# Patient Record
Sex: Male | Born: 1985 | Race: White | Hispanic: No | Marital: Single | Smoking: Current every day smoker
Health system: Southern US, Community
[De-identification: ages and names within clinical notes are randomized; demographics above are authoritative.]

## PROBLEM LIST (undated history)

## (undated) DIAGNOSIS — I517 Cardiomegaly: Secondary | ICD-10-CM

## (undated) DIAGNOSIS — F1110 Opioid abuse, uncomplicated: Secondary | ICD-10-CM

## (undated) HISTORY — PX: HAND SURGERY: SHX662

---
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
## 2010-05-25 ENCOUNTER — Emergency Department (HOSPITAL_COMMUNITY): Payer: Managed Care, Other (non HMO)

## 2010-05-25 ENCOUNTER — Inpatient Hospital Stay (HOSPITAL_COMMUNITY)
Admission: EM | Admit: 2010-05-25 | Discharge: 2010-05-27 | DRG: 464 | Disposition: A | Discharge status: Home / Self Care | Payer: Managed Care, Other (non HMO) | Attending: Orthopedic Surgery | Admitting: Orthopedic Surgery

## 2010-05-25 DIAGNOSIS — W540XXA Bitten by dog, initial encounter: Secondary | ICD-10-CM | POA: Diagnosis present

## 2010-05-25 DIAGNOSIS — L02519 Cutaneous abscess of unspecified hand: Secondary | ICD-10-CM | POA: Diagnosis present

## 2010-05-25 DIAGNOSIS — Z79899 Other long term (current) drug therapy: Secondary | ICD-10-CM

## 2010-05-25 DIAGNOSIS — S66909A Unspecified injury of unspecified muscle, fascia and tendon at wrist and hand level, unspecified hand, initial encounter: Secondary | ICD-10-CM | POA: Diagnosis present

## 2010-05-25 DIAGNOSIS — F341 Dysthymic disorder: Secondary | ICD-10-CM | POA: Diagnosis present

## 2010-05-25 DIAGNOSIS — M65839 Other synovitis and tenosynovitis, unspecified forearm: Principal | ICD-10-CM | POA: Diagnosis present

## 2010-05-25 DIAGNOSIS — S61409A Unspecified open wound of unspecified hand, initial encounter: Secondary | ICD-10-CM | POA: Diagnosis present

## 2010-05-25 LAB — DIFFERENTIAL
Basophils Absolute: 0 10*3/uL (ref 0.0–0.1)
Basophils Relative: 0 % (ref 0–1)
Eosinophils Absolute: 0 10*3/uL (ref 0.0–0.7)
Eosinophils Relative: 0 % (ref 0–5)
Lymphocytes Relative: 12 % (ref 12–46)
Lymphs Abs: 1.6 10*3/uL (ref 0.7–4.0)
Monocytes Absolute: 1.3 10*3/uL — ABNORMAL HIGH (ref 0.1–1.0)
Monocytes Relative: 9 % (ref 3–12)
Neutro Abs: 11.2 10*3/uL — ABNORMAL HIGH (ref 1.7–7.7)
Neutrophils Relative %: 79 % — ABNORMAL HIGH (ref 43–77)

## 2010-05-25 LAB — BASIC METABOLIC PANEL
BUN: 11 mg/dL (ref 6–23)
CO2: 28 mEq/L (ref 19–32)
Calcium: 9.2 mg/dL (ref 8.4–10.5)
Chloride: 104 mEq/L (ref 96–112)
Creatinine, Ser: 0.82 mg/dL (ref 0.4–1.5)
GFR calc Af Amer: >60 mL/min (ref >60)
GFR calc non Af Amer: >60 mL/min (ref >60)
Glucose, Bld: 88 mg/dL (ref 70–99)
Potassium: 3.8 mEq/L (ref 3.5–5.1)
Sodium: 141 mEq/L (ref 135–145)

## 2010-05-25 LAB — CBC
HCT: 45.3 % (ref 39.0–52.0)
Hemoglobin: 15.7 g/dL (ref 13.0–17.0)
MCH: 29.6 pg (ref 26.0–34.0)
MCHC: 34.7 g/dL (ref 30.0–36.0)
MCV: 85.3 fL (ref 78.0–100.0)
Platelets: 109 10*3/uL — ABNORMAL LOW (ref 150–400)
RBC: 5.31 MIL/uL (ref 4.22–5.81)
RDW: 13.3 % (ref 11.5–15.5)
WBC: 14.3 10*3/uL — ABNORMAL HIGH (ref 4.0–10.5)

## 2010-05-28 LAB — WOUND CULTURE: Culture: NO GROWTH

## 2010-05-31 NOTE — Op Note (Signed)
NAME:  Sean Wilkins, Sean Wilkins               ACCOUNT NO.:  [id number]  MEDICAL RECORD NO.:  [id number]           PATIENT TYPE:  LOCATION:                                 FACILITY:  PHYSICIAN:  Dionne Ano. Jyren Cerasoli, M.D.DATE OF BIRTH:  1985/07/21  DATE OF PROCEDURE: DATE OF DISCHARGE:                              OPERATIVE REPORT   PREOPERATIVE DIAGNOSIS:  Right hand dog bite with open MCP joint and index finger bite wound, ascending cellulitis, and pain.  POSTOPERATIVE DIAGNOSIS:  Right hand dog bite with open MCP joint and index finger bite wound, ascending cellulitis, and pain.  PROCEDURE: 1. Irrigation and debridement, right hand open MCP joint.  This was an     open arthrotomy and synovectomy of the metacarpal phalangeal joint     of the right middle finger with excisional debridement of the skin,     subcutaneous tissue, and tendon tissue. 2. Incision and decompression of tendon sheath infection, right middle     finger, secondary to purulent tenosynovitis.  This was a tenolysis     and tenosynovectomy about the extensor apparatus, right hand and     middle finger. 3. Irrigation and debridement of right index finger skin and     subcutaneous tissue.  This was an excisional debridement of     separate dog bite, which did not penetrate the tendon about the     index finger.  SURGEON:  Dionne Ano. Amanda Pea, MD.  ASSISTANT:  None.  COMPLICATIONS:  None.  ANESTHESIA:  General.  DRAINS:  Two vessel loop drains were placed.  TOURNIQUET TIME:  Less than an hour.  INDICATIONS FOR PROCEDURE:  This patient is a 25 year old male, had a dog bite days ago.  He presents with the above-mentioned operative intervention.  Understanding and accepting the risks and benefits of bleeding, infection, anesthesia, damage to normal structures, and failure of the surgery to accomplish its intended goals of relieving symptoms and restoring function.  With this mind, he desires to proceed. All questions  have been encouraged and answered preoperatively.  I have specifically discussed with the patient the dog bite and its associated issues.  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  There are no other injuries.  He complains barely of pain, and given the time frame duration from bite, I feel that I and D is absolutely mandatory.  OPERATIVE PROCEDURE:  The patient was seen by myself and Anesthesia, taken to the operative suite, underwent smooth induction of general anesthesia, laid supine and properly padded, prepped and draped in the usual sterile fashion with Betadine scrub and paint.  Time-out was called.  Consent verified and explained to the patient preoperatively of course.  I commenced the operation with knife blade incision about the middle finger MCP joint.  Dissection was carried down.  Tendon injury was noted.  Tenolysis, tenosynovectomy, and decompression of the tendon sheath was accomplished.  This was a purulent extensor tenosynovitis and I cultured this for aerobic and anaerobic culture.  Following this, I then performed open arthrotomy and synovectomy of the MCP joint.  There was a large amount of devitalized tissue in this region.  It was removed and the joint was exposed.  The chondral surfaces were adequate.  Following this, 3 liters of saline were placed through the wound.  Irrigation debridement of the skin and subcutaneous tissue was then accomplished.  Following this, I then performed irrigation and debridement of the right index finger.  This was irrigation and debridement of the skin and subcutaneous tissue.  The patient tolerated this quite well with no complicating features.  Once this excisional debridement of the index finger was accomplished, I then performed loose closure over the vessel loop drain of the MCP joint, irrigated additionally and placed Adaptic followed by a volar plaster splint.  He was given Unasyn after cultures were taken.  We will plan for Unasyn 3  grams IV q.6 h, Percocet for pain, general postop pain management, elevation, and wound care.  Once he is stable, advanced into Augmentin.  I have discussed [name] and his mother all issues, do's and don'ts, etc.  It has been a pleasure to participate in his care, I look forward to participating in his postop care.     Dionne Ano. Amanda Pea, M.D.     [facility]  D:  05/25/2010  T:  05/26/2010  Job:  [id number]  Electronically Signed by Dominica Severin M.D. on 05/31/2010 08:26:17 PM

## 2012-10-31 ENCOUNTER — Encounter (HOSPITAL_COMMUNITY): Payer: Self-pay | Admitting: *Deleted

## 2012-10-31 ENCOUNTER — Emergency Department (HOSPITAL_COMMUNITY)
Admission: EM | Admit: 2012-10-31 | Discharge: 2012-10-31 | Disposition: A | Discharge status: Home / Self Care | Payer: Self-pay | Attending: Emergency Medicine | Admitting: Emergency Medicine

## 2012-10-31 DIAGNOSIS — F17200 Nicotine dependence, unspecified, uncomplicated: Secondary | ICD-10-CM | POA: Insufficient documentation

## 2012-10-31 DIAGNOSIS — K047 Periapical abscess without sinus: Secondary | ICD-10-CM | POA: Insufficient documentation

## 2012-10-31 MED ORDER — HYDROCODONE-ACETAMINOPHEN 5-325 MG PO TABS
1.0000 | ORAL_TABLET | Freq: Once | ORAL | Status: AC
  Administered 2012-10-31: 1 via ORAL
  Filled 2012-10-31: qty 1

## 2012-10-31 MED ORDER — AMOXICILLIN 500 MG PO CAPS: 500.0000 mg | ORAL_CAPSULE | Freq: Three times a day (TID) | ORAL | Status: DC

## 2012-10-31 MED ORDER — HYDROCODONE-ACETAMINOPHEN 5-325 MG PO TABS: 1.0000 | ORAL_TABLET | ORAL | Status: DC | PRN

## 2012-10-31 MED ORDER — IBUPROFEN 600 MG PO TABS: 600.0000 mg | ORAL_TABLET | Freq: Four times a day (QID) | ORAL | Status: DC | PRN

## 2012-10-31 MED ORDER — AMOXICILLIN 250 MG PO CAPS
500.0000 mg | ORAL_CAPSULE | Freq: Once | ORAL | Status: AC
  Administered 2012-10-31: 500 mg via ORAL
  Filled 2012-10-31: qty 2

## 2012-10-31 NOTE — ED Notes (Signed)
Dental pain to right upper tooth. Swelling to right side of the face. Swelling began last night. States eye was swollen shut this morning.

## 2012-10-31 NOTE — ED Provider Notes (Signed)
CSN: [id number]     Arrival date & time 10/31/12  1235 History   First MD Initiated Contact with Patient 10/31/12 1255     Chief Complaint  Patient presents with  . Dental Pain   (Consider location/radiation/quality/duration/timing/severity/associated sxs/prior Treatment) Patient is a 27 y.o. male presenting with tooth pain. The history is provided by the patient.  Dental Pain Location:  Upper Upper teeth location:  7/RU lateral incisor Quality:  Throbbing and constant Onset quality:  Gradual Duration:  2 days Progression:  Worsening Chronicity:  New Context: abscess, dental caries, dental fracture and poor dentition   Relieved by:  Nothing Worsened by:  Cold food/drink, hot food/drink and pressure Associated symptoms: facial pain and facial swelling   Associated symptoms: no fever and no headaches   Risk factors: lack of dental care and smoking    Sean Wilkins is a 27 y.o. male who presents to the ED with dental pain and facial swelling. States he has chronic dental problems but last night was hurting and this morning facial swelling and increased pain.   History reviewed. No pertinent past medical history. Past Surgical History  Procedure Laterality Date  . Hand surgery     No family history on file. History  Substance Use Topics  . Smoking status: Current Every Day Smoker    Types: Cigarettes  . Smokeless tobacco: Former Neurosurgeon  . Alcohol Use: No    Review of Systems  Constitutional: Negative for fever and chills.  HENT: Positive for facial swelling and dental problem.   Eyes: Negative for redness.  Gastrointestinal: Negative for nausea and vomiting.  Musculoskeletal: Negative for back pain.  Skin: Negative for rash.  Neurological: Negative for dizziness and headaches.  Psychiatric/Behavioral: The patient is not nervous/anxious.     Allergies  Review of patient's allergies indicates no known allergies.  Home Medications   Current Outpatient Rx  Name  Route   Sig  Dispense  Refill  . ibuprofen (ADVIL,MOTRIN) 200 MG tablet   Oral   Take 400 mg by mouth every 6 (six) hours as needed for pain.          BP 111/71  Pulse 85  Temp(Src) 98.1 F (36.7 C) (Oral)  Resp 20  Ht 6' (1.829 m)  Wt 135 lb (61.236 kg)  BMI 18.31 kg/m2  SpO2 100% Physical Exam  Nursing note and vitals reviewed. Constitutional: He is oriented to person, place, and time. He appears well-developed and well-nourished.  HENT:  Head: Head is without right periorbital erythema.    Mouth/Throat: Uvula is midline, oropharynx is clear and moist and mucous membranes are normal.    Facial swelling right. Multiple caries with abscess right upper.   Eyes: Conjunctivae and EOM are normal.  Neck: Normal range of motion. Neck supple.  Cardiovascular: Normal rate and regular rhythm.   Pulmonary/Chest: Effort normal and breath sounds normal.  Musculoskeletal: Normal range of motion.  Lymphadenopathy:    He has no cervical adenopathy.  Neurological: He is alert and oriented to person, place, and time. No cranial nerve deficit.  Skin: Skin is warm and dry.  Psychiatric: He has a normal mood and affect. His behavior is normal.    ED Course  Procedures Discussed in detail with the patient need for antibiotics and follow up with dentist as soon as possible and if swelling increases, fever, or other problems to return here. MDM  27 y.o. male with dental caries and dental abscess. Discussed with the patient in detail need  for follow up as soon as possible for dental care. Discussed infection and if spreads and he has severe headache, n/v, fever or other problems to return immediately.  Patient voices understanding.    Medication List         amoxicillin 500 MG capsule  Commonly known as:  AMOXIL  Take 1 capsule (500 mg total) by mouth 3 (three) times daily.     HYDROcodone-acetaminophen 5-325 MG per tablet  Commonly known as:  NORCO/VICODIN  Take 1 tablet by mouth every 4  (four) hours as needed.     ibuprofen 600 MG tablet  Commonly known as:  ADVIL,MOTRIN  Take 1 tablet (600 mg total) by mouth every 6 (six) hours as needed for pain.           [facility] Orlene Och, [place] 10/31/12 1452

## 2012-11-01 NOTE — ED Provider Notes (Signed)
Medical screening examination/treatment/procedure(s) were performed by non-physician practitioner and as supervising physician I was immediately available for consultation/collaboration.   Felipa Laroche David Charitie Hinote, MD 11/01/12 0813 

## 2012-12-27 ENCOUNTER — Emergency Department (HOSPITAL_COMMUNITY)
Admission: EM | Admit: 2012-12-27 | Discharge: 2012-12-27 | Disposition: A | Discharge status: Home / Self Care | Payer: Self-pay | Attending: Emergency Medicine | Admitting: Emergency Medicine

## 2012-12-27 ENCOUNTER — Encounter (HOSPITAL_COMMUNITY): Payer: Self-pay | Admitting: Emergency Medicine

## 2012-12-27 ENCOUNTER — Emergency Department (HOSPITAL_COMMUNITY): Payer: Self-pay

## 2012-12-27 DIAGNOSIS — F17200 Nicotine dependence, unspecified, uncomplicated: Secondary | ICD-10-CM | POA: Insufficient documentation

## 2012-12-27 DIAGNOSIS — M25569 Pain in unspecified knee: Secondary | ICD-10-CM | POA: Insufficient documentation

## 2012-12-27 DIAGNOSIS — Z792 Long term (current) use of antibiotics: Secondary | ICD-10-CM | POA: Insufficient documentation

## 2012-12-27 DIAGNOSIS — M25561 Pain in right knee: Secondary | ICD-10-CM

## 2012-12-27 HISTORY — DX: Cardiomegaly: I51.7

## 2012-12-27 MED ORDER — NAPROXEN 500 MG PO TABS: 500.0000 mg | ORAL_TABLET | Freq: Two times a day (BID) | ORAL | Status: DC

## 2012-12-27 MED ORDER — HYDROCODONE-ACETAMINOPHEN 5-325 MG PO TABS: 1.0000 | ORAL_TABLET | ORAL | Status: DC | PRN

## 2012-12-27 NOTE — ED Provider Notes (Signed)
Medical screening examination/treatment/procedure(s) were performed by non-physician practitioner and as supervising physician I was immediately available for consultation/collaboration.  EKG Interpretation   None       Yandell Mcjunkins, MD, FACEP   Matthew Cina L Castella Lerner, MD 12/27/12 1555 

## 2012-12-27 NOTE — ED Notes (Signed)
Rt knee pain/swelling for 3-4 days, no known injury

## 2012-12-27 NOTE — ED Provider Notes (Signed)
CSN: [id number]     Arrival date & time 12/27/12  1406 History   First MD Initiated Contact with Patient 12/27/12 1418     Chief Complaint  Patient presents with  . Knee Pain   (Consider location/radiation/quality/duration/timing/severity/associated sxs/prior Treatment) Patient is a 27 y.o. male presenting with knee pain. The history is provided by the patient.  Knee Pain Location:  Knee Time since incident:  3 days Injury: no   Knee location:  R knee Pain details:    Quality:  Aching   Radiates to:  Does not radiate   Severity:  Moderate   Onset quality:  Gradual   Duration:  3 days   Timing:  Constant Chronicity:  New Dislocation: no   Foreign body present:  No foreign bodies Prior injury to area:  No Relieved by:  Nothing Worsened by:  Bearing weight Ineffective treatments:  None tried Associated symptoms: swelling   Associated symptoms: no fever    Nehemiah Mcfarren is a 27 y.o. male who presents to the ED with right knee pain. The pain started after he had been going up and down a ladder helping his parents do some work. He has noted swelling in the right knee. He has taken Advil for pain without relief.    Past Medical History  Diagnosis Date  . Heart enlargement    Past Surgical History  Procedure Laterality Date  . Hand surgery     History reviewed. No pertinent family history. History  Substance Use Topics  . Smoking status: Current Every Day Smoker    Types: Cigarettes  . Smokeless tobacco: Former Neurosurgeon  . Alcohol Use: No    Review of Systems  Constitutional: Negative for fever and chills.  HENT: Negative.   Eyes: Negative for visual disturbance.  Respiratory: Negative for chest tightness and shortness of breath.   Cardiovascular: Negative for chest pain.  Gastrointestinal: Negative for nausea and vomiting.  Genitourinary: Negative for dysuria and urgency.  Musculoskeletal:       Right knee pain  Skin: Negative for wound.  Allergic/Immunologic:  Negative for immunocompromised state.  Neurological: Negative for light-headedness and headaches.  Psychiatric/Behavioral: The patient is not nervous/anxious.     Allergies  Review of patient's allergies indicates no known allergies.  Home Medications   Current Outpatient Rx  Name  Route  Sig  Dispense  Refill  . amoxicillin (AMOXIL) 500 MG capsule   Oral   Take 1 capsule (500 mg total) by mouth 3 (three) times daily.   30 capsule   0   . HYDROcodone-acetaminophen (NORCO/VICODIN) 5-325 MG per tablet   Oral   Take 1 tablet by mouth every 4 (four) hours as needed.   15 tablet   0   . ibuprofen (ADVIL,MOTRIN) 600 MG tablet   Oral   Take 1 tablet (600 mg total) by mouth every 6 (six) hours as needed for pain.   30 tablet   0    BP 108/70  Pulse 86  Temp(Src) 97.3 F (36.3 C) (Oral)  Resp 18  Ht 6' (1.829 m)  Wt 135 lb (61.236 kg)  BMI 18.31 kg/m2  SpO2 92% Physical Exam  Nursing note and vitals reviewed. Constitutional: He is oriented to person, place, and time. He appears well-developed and well-nourished. No distress.  HENT:  Head: Normocephalic and atraumatic.  Eyes: Conjunctivae and EOM are normal.  Neck: Normal range of motion. Neck supple.  Cardiovascular: Normal rate.   Pulmonary/Chest: Effort normal.  Musculoskeletal:  Right knee: He exhibits swelling. He exhibits normal range of motion, no ecchymosis, no deformity, no laceration, no erythema, normal alignment and no LCL laxity. Tenderness found. MCL tenderness noted.  Neurological: He is alert and oriented to person, place, and time. No cranial nerve deficit.  Skin: Skin is warm and dry.  Psychiatric: He has a normal mood and affect. His behavior is normal.    ED Course  Procedures  Dg Knee Complete 4 Views Right  12/27/2012   CLINICAL DATA:  Right knee pain for 3 days.  No known injury.  EXAM: RIGHT KNEE - COMPLETE 4+ VIEW  COMPARISON:  None.  FINDINGS: There is no evidence of fracture,  dislocation, or joint effusion. There is no evidence of arthropathy or other focal bone abnormality. Soft tissues are unremarkable.  IMPRESSION: Negative.   Electronically Signed   By: Drusilla Kanner M.D.   On: 12/27/2012 15:09    MDM  27 y.o. male with pain and swelling in his right knee after going up and down a ladder several times 3 days ago. Placed in knee immobilizer, ice, elevation and follow up with ortho if symptoms persist.  I have reviewed this patient's vital signs, nurses notes, appropriate imaging and discussed findings and plan of care with the patient. He voices understanding. Stable for discharge without any immediate complications.    Medication List         HYDROcodone-acetaminophen 5-325 MG per tablet  Commonly known as:  NORCO/VICODIN  Take 1 tablet by mouth every 4 (four) hours as needed.     naproxen 500 MG tablet  Commonly known as:  NAPROSYN  Take 1 tablet (500 mg total) by mouth 2 (two) times daily with a meal.             Janne Napoleon, NP 12/27/12 1541

## 2012-12-30 ENCOUNTER — Emergency Department (HOSPITAL_COMMUNITY)
Admission: EM | Admit: 2012-12-30 | Discharge: 2012-12-30 | Disposition: A | Discharge status: Home / Self Care | Payer: Self-pay | Attending: Emergency Medicine | Admitting: Emergency Medicine

## 2012-12-30 ENCOUNTER — Encounter (HOSPITAL_COMMUNITY): Payer: Self-pay | Admitting: Emergency Medicine

## 2012-12-30 DIAGNOSIS — K0889 Other specified disorders of teeth and supporting structures: Secondary | ICD-10-CM

## 2012-12-30 DIAGNOSIS — F17200 Nicotine dependence, unspecified, uncomplicated: Secondary | ICD-10-CM | POA: Insufficient documentation

## 2012-12-30 DIAGNOSIS — Z8679 Personal history of other diseases of the circulatory system: Secondary | ICD-10-CM | POA: Insufficient documentation

## 2012-12-30 DIAGNOSIS — Z79899 Other long term (current) drug therapy: Secondary | ICD-10-CM | POA: Insufficient documentation

## 2012-12-30 DIAGNOSIS — K089 Disorder of teeth and supporting structures, unspecified: Secondary | ICD-10-CM | POA: Insufficient documentation

## 2012-12-30 DIAGNOSIS — K029 Dental caries, unspecified: Secondary | ICD-10-CM | POA: Insufficient documentation

## 2012-12-30 MED ORDER — IBUPROFEN 800 MG PO TABS: 800.0000 mg | ORAL_TABLET | Freq: Three times a day (TID) | ORAL | Status: DC

## 2012-12-30 MED ORDER — TRAMADOL HCL 50 MG PO TABS: ORAL_TABLET | ORAL | Status: DC

## 2012-12-30 MED ORDER — AMOXICILLIN 500 MG PO CAPS: 500.0000 mg | ORAL_CAPSULE | Freq: Three times a day (TID) | ORAL | Status: DC

## 2012-12-30 NOTE — ED Notes (Signed)
Pt c/o right upper dental pain since yesterday.

## 2012-12-30 NOTE — ED Provider Notes (Signed)
CSN: [id number]     Arrival date & time 12/30/12  1736 History   First MD Initiated Contact with Patient 12/30/12 1738     Chief Complaint  Patient presents with  . Dental Pain   (Consider location/radiation/quality/duration/timing/severity/associated sxs/prior Treatment) Patient is a 27 y.o. male presenting with tooth pain. The history is provided by the patient.  Dental Pain Location:  Upper Upper teeth location:  2/RU 2nd molar Quality:  Aching Severity:  Moderate Duration:  1 day Timing:  Constant Progression:  Worsening Chronicity:  Chronic Context: poor dentition   Context: not trauma   Worsened by:  Hot food/drink Ineffective treatments:  None tried Associated symptoms: no difficulty swallowing and no neck pain   Risk factors: smoking     Past Medical History  Diagnosis Date  . Heart enlargement    Past Surgical History  Procedure Laterality Date  . Hand surgery     History reviewed. No pertinent family history. History  Substance Use Topics  . Smoking status: Current Every Day Smoker    Types: Cigarettes  . Smokeless tobacco: Former Neurosurgeon  . Alcohol Use: No    Review of Systems  Constitutional: Negative for activity change.       All ROS Neg except as noted in HPI  HENT: Positive for dental problem. Negative for nosebleeds.   Eyes: Negative for photophobia and discharge.  Respiratory: Negative for cough, shortness of breath and wheezing.   Cardiovascular: Negative for chest pain and palpitations.  Gastrointestinal: Negative for abdominal pain and blood in stool.  Genitourinary: Negative for dysuria, frequency and hematuria.  Musculoskeletal: Negative for arthralgias, back pain and neck pain.  Skin: Negative.   Neurological: Negative for dizziness, seizures and speech difficulty.  Psychiatric/Behavioral: Negative for hallucinations and confusion.    Allergies  Review of patient's allergies indicates no known allergies.  Home Medications   Current  Outpatient Rx  Name  Route  Sig  Dispense  Refill  . HYDROcodone-acetaminophen (NORCO/VICODIN) 5-325 MG per tablet   Oral   Take 1 tablet by mouth every 4 (four) hours as needed.   15 tablet   0   . naproxen (NAPROSYN) 500 MG tablet   Oral   Take 1 tablet (500 mg total) by mouth 2 (two) times daily with a meal.   15 tablet   0    BP 106/60  Pulse 70  Temp(Src) 97.5 F (36.4 C) (Oral)  Resp 24  Ht 6' (1.829 m)  Wt 135 lb (61.236 kg)  BMI 18.31 kg/m2  SpO2 100% Physical Exam  Nursing note and vitals reviewed. Constitutional: He is oriented to person, place, and time. He appears well-developed and well-nourished.  Non-toxic appearance.  HENT:  Head: Normocephalic.  Right Ear: Tympanic membrane and external ear normal.  Left Ear: Tympanic membrane and external ear normal.  Multiple dental caries of the upper and lower jaw. Mild swelling of the right upper gum area. No visible abscess. Airway patent. No swelling under the tongue.  Eyes: EOM and lids are normal. Pupils are equal, round, and reactive to light.  Neck: Normal range of motion. Neck supple. Carotid bruit is not present.  Cardiovascular: Normal rate, regular rhythm, normal heart sounds, intact distal pulses and normal pulses.   Pulmonary/Chest: Breath sounds normal. No respiratory distress.  Abdominal: Soft. Bowel sounds are normal. There is no tenderness. There is no guarding.  Musculoskeletal: Normal range of motion.  Lymphadenopathy:       Head (right side): No submandibular  adenopathy present.       Head (left side): No submandibular adenopathy present.    He has no cervical adenopathy.  Neurological: He is alert and oriented to person, place, and time. He has normal strength. No cranial nerve deficit or sensory deficit.  Skin: Skin is warm and dry.  Psychiatric: He has a normal mood and affect. His speech is normal.    ED Course  Procedures (including critical care time) Labs Review Labs Reviewed - No data  to display Imaging Review No results found.  EKG Interpretation   None       MDM  No diagnosis found. **I have reviewed nursing notes, vital signs, and all appropriate lab and imaging results for this patient.*  Vital signs wnl. No visible abscess. No evidence for Ludwig's Angina. Dental resource guide given to the patient. Rx for ibuprofen, tramadol, and amoxil given to the patient. Pt advised to see a dentist as soon as possible.  Kathie Dike, PA-C 12/30/12 [phone]

## 2013-01-01 NOTE — ED Provider Notes (Signed)
Medical screening examination/treatment/procedure(s) were performed by non-physician practitioner and as supervising physician I was immediately available for consultation/collaboration.  EKG Interpretation   None        Donnetta Hutching, MD 01/01/13 1054

## 2013-08-19 ENCOUNTER — Encounter (HOSPITAL_COMMUNITY): Payer: Self-pay | Admitting: Emergency Medicine

## 2013-08-19 ENCOUNTER — Emergency Department (HOSPITAL_COMMUNITY)
Admission: EM | Admit: 2013-08-19 | Discharge: 2013-08-19 | Disposition: A | Discharge status: Home / Self Care | Payer: Self-pay | Attending: Emergency Medicine | Admitting: Emergency Medicine

## 2013-08-19 DIAGNOSIS — F17200 Nicotine dependence, unspecified, uncomplicated: Secondary | ICD-10-CM | POA: Insufficient documentation

## 2013-08-19 DIAGNOSIS — L255 Unspecified contact dermatitis due to plants, except food: Secondary | ICD-10-CM | POA: Insufficient documentation

## 2013-08-19 DIAGNOSIS — K029 Dental caries, unspecified: Secondary | ICD-10-CM | POA: Insufficient documentation

## 2013-08-19 DIAGNOSIS — K089 Disorder of teeth and supporting structures, unspecified: Secondary | ICD-10-CM | POA: Insufficient documentation

## 2013-08-19 DIAGNOSIS — Z8679 Personal history of other diseases of the circulatory system: Secondary | ICD-10-CM | POA: Insufficient documentation

## 2013-08-19 MED ORDER — PREDNISONE 10 MG PO TABS: 20.0000 mg | ORAL_TABLET | Freq: Two times a day (BID) | ORAL | Status: DC

## 2013-08-19 MED ORDER — DEXAMETHASONE SODIUM PHOSPHATE 4 MG/ML IJ SOLN
8.0000 mg | Freq: Once | INTRAMUSCULAR | Status: AC
  Administered 2013-08-19: 8 mg via INTRAMUSCULAR
  Filled 2013-08-19: qty 2

## 2013-08-19 MED ORDER — CETIRIZINE HCL 10 MG PO CAPS: 1.0000 | ORAL_CAPSULE | Freq: Every day | ORAL | Status: DC

## 2013-08-19 MED ORDER — AMOXICILLIN 500 MG PO CAPS
500.0000 mg | ORAL_CAPSULE | Freq: Three times a day (TID) | ORAL | Status: DC
Start: 2013-08-19 — End: 2014-06-26

## 2013-08-19 NOTE — ED Provider Notes (Signed)
CSN: [id number]     Arrival date & time 08/19/13  1404 History   First [id number] Initiated Contact with Patient 08/19/13 1521     Chief Complaint  Patient presents with  . Rash   Patient is a 28 y.o. male presenting with rash. The history is provided by the patient. No language interpreter was used.  Rash Associated symptoms: no fever    This chart was scribed for nurse practitioner working with Benny LennertJoseph L Zammit, MD, by Andrew Auaven Small, ED Scribe. This patient was seen in room APFT23/APFT23 and the patient's care was started at 3:23 PM.  Sean Wilkins is a 28 y.o. male who presents to the Emergency Department complaining of and itchy erythematous rash. 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  Rash is located to bilateral arms and torso.    Pt is also c/o dental pain. Pt reports pain to left lower 2nd molar. He is unsure of any chips and breaks to tooth. Pt has taken ibuprofen with very mild relief to pain.   Past Medical History  Diagnosis Date  . Heart enlargement    Past Surgical History  Procedure Laterality Date  . Hand surgery     History reviewed. No pertinent family history. History  Substance Use Topics  . Smoking status: Current Every Day Smoker    Types: Cigarettes  . Smokeless tobacco: Former NeurosurgeonUser  . Alcohol Use: No    Review of Systems  Constitutional: Negative for fever and chills.  HENT: Positive for dental problem.   Skin: Positive for color change and rash. Negative for wound.  all other systems negative  Allergies  Review of patient's allergies indicates no known allergies.  Home Medications   Prior to Admission medications   Medication Sig Start Date End Date Taking? Authorizing Provider  diphenhydrAMINE (BENADRYL) 25 MG tablet Take 25 mg by mouth once as needed.   Yes Historical Provider, MD  ibuprofen (ADVIL,MOTRIN) 200 MG tablet Take 800 mg by mouth every 6 (six) hours as needed.   Yes Historical  Provider, MD  OVER THE COUNTER MEDICATION Apply 1 application topically as needed [facility](TEKNU-For Poison Ivy/[place]).   Yes Historical Provider, MD   BP 116/64  Pulse 64  Temp(Src) 98 F (36.7 C) (Oral)  Resp 18  Ht 6' (1.829 m)  Wt 135 lb (61.236 kg)  BMI 18.31 kg/m2  SpO2 100% Physical Exam  Nursing note and vitals reviewed. Constitutional: He is oriented to person, place, and time. He appears well-developed and well-nourished. No distress.  HENT:  Head: Normocephalic and atraumatic.  Left lower 2nd molar with erythema and swelling on gum surrounding the tooth. 1st molar has been extracted  Eyes: Conjunctivae and EOM are normal.  Neck: Neck supple.  Cardiovascular: Normal rate.   Pulmonary/Chest: Effort normal.  Musculoskeletal: Normal range of motion.  Neurological: He is alert and oriented to person, place, and time.  Skin: Skin is warm and dry.  Vesicular linear rash consistent with contact dermatitis with plant located bilateral forearms, abdomen and chest  Psychiatric: He has a normal mood and affect. His behavior is normal.    ED Course  Procedures (including critical care time) 3:30 PM-Discussed treatment plan which includes abx and steroids with pt at bedside and pt agreed to plan.   MDM  28 y.o. male with rash and itching after exposure to poison ivy while doing landscaping work. Also with dental pain. Will treat for contact  dermitis and dental infection. Stable for discharge without fever. He does not appear toxic.Marland Kitchen. Discussed with the patient and all questioned fully answered.    Medication List    TAKE these medications       amoxicillin 500 MG capsule  Commonly known as:  AMOXIL  Take 1 capsule (500 mg total) by mouth 3 (three) times daily.     Cetirizine HCl 10 MG Caps  Commonly known as:  ZYRTEC ALLERGY  Take 1 capsule (10 mg total) by mouth daily.     predniSONE 10 MG tablet  Commonly known as:  DELTASONE  Take 2 tablets (20 mg total) by mouth 2 (two) times  daily with a meal.      ASK your doctor about these medications       diphenhydrAMINE 25 MG tablet  Commonly known as:  BENADRYL  Take 25 mg by mouth once as needed.     ibuprofen 200 MG tablet  Commonly known as:  ADVIL,MOTRIN  Take 800 mg by mouth every 6 (six) hours as needed.     OVER THE COUNTER MEDICATION  Apply 1 application topically as needed [facility](TEKNU-For Poison Ivy/[place]).        I personally performed the services described in this documentation, which was scribed in my presence. The recorded information has been reviewed and is accurate.      [place] M Latasha Buczk[place], [place] 08/20/13 [phone]

## 2013-08-19 NOTE — ED Notes (Addendum)
Itching rash to both arms and small area on abd.  Dental pain

## 2013-08-19 NOTE — Discharge Instructions (Signed)
Follow up with a dentist as soon as possible for your tooth problem. Return as needed for worsening symptoms of your rash.   Contact Dermatitis Contact dermatitis is a reaction to certain substances that touch the skin. Contact dermatitis can be either irritant contact dermatitis or allergic contact dermatitis. Irritant contact dermatitis does not require previous exposure to the substance for a reaction to occur.Allergic contact dermatitis only occurs if you have been exposed to the substance before. Upon a repeat exposure, your body reacts to the substance.  CAUSES  Many substances can cause contact dermatitis. Irritant dermatitis is most commonly caused by repeated exposure to mildly irritating substances, such as:  Makeup.  Soaps.  Detergents.  Bleaches.  Acids.  Metal salts, such as nickel. Allergic contact dermatitis is most commonly caused by exposure to:  Poisonous plants.  Chemicals (deodorants, shampoos).  Jewelry.  Latex.  Neomycin in triple antibiotic cream.  Preservatives in products, including clothing. SYMPTOMS  The area of skin that is exposed may develop:  Dryness or flaking.  Redness.  Cracks.  Itching.  Pain or a burning sensation.  Blisters. With allergic contact dermatitis, there may also be swelling in areas such as the eyelids, mouth, or genitals.  DIAGNOSIS  Your caregiver can usually tell what the problem is by doing a physical exam. In cases where the cause is uncertain and an allergic contact dermatitis is suspected, a patch skin test may be performed to help determine the cause of your dermatitis. TREATMENT Treatment includes protecting the skin from further contact with the irritating substance by avoiding that substance if possible. Barrier creams, powders, and gloves may be helpful. Your caregiver may also recommend:  Steroid creams or ointments applied 2 times daily. For best results, soak the rash area in cool water for 20 minutes.  Then apply the medicine. Cover the area with a plastic wrap. You can store the steroid cream in the refrigerator for a "chilly" effect on your rash. That may decrease itching. Oral steroid medicines may be needed in more severe cases.  Antibiotics or antibacterial ointments if a skin infection is present.  Antihistamine lotion or an antihistamine taken by mouth to ease itching.  Lubricants to keep moisture in your skin.  Burow's solution to reduce redness and soreness or to dry a weeping rash. Mix one packet or tablet of solution in 2 cups cool water. Dip a clean washcloth in the mixture, wring it out a bit, and put it on the affected area. Leave the cloth in place for 30 minutes. Do this as often as possible throughout the day.  Taking several cornstarch or baking soda baths daily if the area is too large to cover with a washcloth. Harsh chemicals, such as alkalis or acids, can cause skin damage that is like a burn. You should flush your skin for 15 to 20 minutes with cold water after such an exposure. You should also seek immediate medical care after exposure. Bandages (dressings), antibiotics, and pain medicine may be needed for severely irritated skin.  HOME CARE INSTRUCTIONS  Avoid the substance that caused your reaction.  Keep the area of skin that is affected away from hot water, soap, sunlight, chemicals, acidic substances, or anything else that would irritate your skin.  Do not scratch the rash. Scratching may cause the rash to become infected.  You may take cool baths to help stop the itching.  Only take over-the-counter or prescription medicines as directed by your caregiver.  See your caregiver for follow-up  care as directed to make sure your skin is healing properly. SEEK MEDICAL CARE IF:   Your condition is not better after 3 days of treatment.  You seem to be getting worse.  You see signs of infection such as swelling, tenderness, redness, soreness, or warmth in the  affected area.  You have any problems related to your medicines. Document Released: 01/29/2000 Document Revised: 04/25/2011 Document Reviewed: 07/06/2010 [facility] Patient Information 2015 [employer], [place]. This information is not intended to replace advice given to you by your health care provider. Make sure you discuss any questions you have with your health care provider.

## 2013-08-22 NOTE — ED Provider Notes (Signed)
Medical screening examination/treatment/procedure(s) were performed by non-physician practitioner and as supervising physician I was immediately available for consultation/collaboration.   EKG Interpretation None        Nyko Gell L Eather Chaires, MD 08/22/13 1353 

## 2013-11-01 ENCOUNTER — Encounter (HOSPITAL_COMMUNITY): Payer: Self-pay | Admitting: Emergency Medicine

## 2013-11-01 ENCOUNTER — Emergency Department (HOSPITAL_COMMUNITY)
Admission: EM | Admit: 2013-11-01 | Discharge: 2013-11-01 | Disposition: A | Discharge status: Home / Self Care | Payer: Self-pay | Attending: Emergency Medicine | Admitting: Emergency Medicine

## 2013-11-01 DIAGNOSIS — F17200 Nicotine dependence, unspecified, uncomplicated: Secondary | ICD-10-CM | POA: Insufficient documentation

## 2013-11-01 DIAGNOSIS — K029 Dental caries, unspecified: Secondary | ICD-10-CM | POA: Insufficient documentation

## 2013-11-01 DIAGNOSIS — IMO0002 Reserved for concepts with insufficient information to code with codable children: Secondary | ICD-10-CM | POA: Insufficient documentation

## 2013-11-01 DIAGNOSIS — Z792 Long term (current) use of antibiotics: Secondary | ICD-10-CM | POA: Insufficient documentation

## 2013-11-01 DIAGNOSIS — K089 Disorder of teeth and supporting structures, unspecified: Secondary | ICD-10-CM | POA: Insufficient documentation

## 2013-11-01 DIAGNOSIS — Z79899 Other long term (current) drug therapy: Secondary | ICD-10-CM | POA: Insufficient documentation

## 2013-11-01 DIAGNOSIS — Z8679 Personal history of other diseases of the circulatory system: Secondary | ICD-10-CM | POA: Insufficient documentation

## 2013-11-01 MED ORDER — IBUPROFEN 800 MG PO TABS
800.0000 mg | ORAL_TABLET | Freq: Once | ORAL | Status: AC
  Administered 2013-11-01: 800 mg via ORAL
  Filled 2013-11-01: qty 1

## 2013-11-01 MED ORDER — ACETAMINOPHEN 325 MG PO TABS
650.0000 mg | ORAL_TABLET | Freq: Once | ORAL | Status: AC
  Administered 2013-11-01: 650 mg via ORAL
  Filled 2013-11-01: qty 2

## 2013-11-01 MED ORDER — CLINDAMYCIN HCL 150 MG PO CAPS
300.0000 mg | ORAL_CAPSULE | Freq: Once | ORAL | Status: AC
  Administered 2013-11-01: 300 mg via ORAL
  Filled 2013-11-01: qty 2

## 2013-11-01 MED ORDER — IBUPROFEN 800 MG PO TABS: 800.0000 mg | ORAL_TABLET | Freq: Three times a day (TID) | ORAL | Status: DC

## 2013-11-01 MED ORDER — CLINDAMYCIN HCL 150 MG PO CAPS: ORAL_CAPSULE | ORAL | Status: DC

## 2013-11-01 NOTE — ED Notes (Signed)
Dental pain to left upper side x 4 days.

## 2013-11-01 NOTE — Discharge Instructions (Signed)
Dental Caries °Dental caries is tooth decay. This decay can cause a hole in teeth (cavity) that can get bigger and deeper over time. °HOME CARE °· Brush and floss your teeth. Do this at least two times a day. °· Use a fluoride toothpaste. °· Use a mouth rinse if told by your dentist or doctor. °· Eat less sugary and starchy foods. Drink less sugary drinks. °· Avoid snacking often on sugary and starchy foods. Avoid sipping often on sugary drinks. °· Keep regular checkups and cleanings with your dentist. °· Use fluoride supplements if told by your dentist or doctor. °· Allow fluoride to be applied to teeth if told by your dentist or doctor. °Document Released: 11/10/2007 Document Revised: 06/17/2013 Document Reviewed: 02/03/2012 °ExitCare® Patient Information ©2015 [employer]. This information is not intended to replace advice given to you by your health care provider. Make sure you discuss any questions you have with your health care provider. ° °

## 2013-11-01 NOTE — ED Notes (Signed)
Mult dental caries.  Pain lt upper molar.

## 2013-11-01 NOTE — ED Provider Notes (Signed)
Medical screening examination/treatment/procedure(s) were performed by non-physician practitioner and as supervising physician I was immediately available for consultation/collaboration.   EKG Interpretation None        Corsica Franson M Dyanna Seiter, MD 11/01/13 2050 

## 2013-11-01 NOTE — ED Provider Notes (Signed)
CSN: [id number]     Arrival date & time 11/01/13  1341 History   First MD Initiated Contact with Patient 11/01/13 1545     Chief Complaint  Patient presents with  . Dental Pain     (Consider location/radiation/quality/duration/timing/severity/associated sxs/prior Treatment) Patient is a 28 y.o. male presenting with tooth pain. The history is provided by the patient.  Dental Pain Location:  Upper Quality:  Throbbing Severity:  Severe Onset quality:  Gradual Duration:  4 days Timing:  Intermittent Progression:  Worsening Chronicity:  Chronic Context: dental caries and poor dentition   Relieved by:  Nothing Ineffective treatments:  NSAIDs Associated symptoms: gum swelling   Associated symptoms: no fever and no neck pain   Risk factors: lack of dental care and smoking     Past Medical History  Diagnosis Date  . Heart enlargement    Past Surgical History  Procedure Laterality Date  . Hand surgery     No family history on file. History  Substance Use Topics  . Smoking status: Current Every Day Smoker    Types: Cigarettes  . Smokeless tobacco: Former Neurosurgeon  . Alcohol Use: No    Review of Systems  Constitutional: Negative for fever and activity change.       All ROS Neg except as noted in HPI  HENT: Positive for dental problem.   Eyes: Negative for photophobia and discharge.  Respiratory: Negative for cough, shortness of breath and wheezing.   Cardiovascular: Negative for chest pain and palpitations.  Gastrointestinal: Negative for abdominal pain and blood in stool.  Genitourinary: Negative for dysuria, frequency and hematuria.  Musculoskeletal: Negative for arthralgias, back pain and neck pain.  Skin: Negative.   Neurological: Negative.   Psychiatric/Behavioral: Negative for hallucinations and confusion.      Allergies  Review of patient's allergies indicates no known allergies.  Home Medications   Prior to Admission medications   Medication Sig Start Date  End Date Taking? Authorizing Provider  amoxicillin (AMOXIL) 500 MG capsule Take 1 capsule (500 mg total) by mouth 3 (three) times daily. 08/19/13   Hope Orlene Och, NP  Cetirizine HCl (ZYRTEC ALLERGY) 10 MG CAPS Take 1 capsule (10 mg total) by mouth daily. 08/19/13   Hope Orlene Och, NP  clindamycin (CLEOCIN) 150 MG capsule 1 po qid with food 11/01/13   Kathie Dike, PA-C  diphenhydrAMINE (BENADRYL) 25 MG tablet Take 25 mg by mouth once as needed.    Historical Provider, MD  ibuprofen (ADVIL,MOTRIN) 200 MG tablet Take 800 mg by mouth every 6 (six) hours as needed.    Historical Provider, MD  ibuprofen (ADVIL,MOTRIN) 800 MG tablet Take 1 tablet (800 mg total) by mouth 3 (three) times daily. 11/01/13   Kathie Dike, PA-C  OVER THE COUNTER MEDICATION Apply 1 application topically as needed [facility]).    Historical Provider, MD  predniSONE (DELTASONE) 10 MG tablet Take 2 tablets (20 mg total) by mouth 2 (two) times daily with a meal. 08/19/13   Hope Orlene Och, NP   BP 104/61  Pulse 64  Temp(Src) 99.1 F (37.3 C) (Oral)  Resp 16  Ht 6' (1.829 m)  Wt 140 lb (63.504 kg)  BMI 18.98 kg/m2  SpO2 99% Physical Exam  Nursing note and vitals reviewed. Constitutional: He is oriented to person, place, and time. He appears well-developed and well-nourished.  Non-toxic appearance.  HENT:  Head: Normocephalic.  Right Ear: Tympanic membrane and external ear normal.  Left Ear: Tympanic membrane  and external ear normal.  Multiple dental caries noted. Patient has pain to palpation of the left upper gum with some swelling of the gum, and evidence of gingival disease. There no visible abscess of the upper or lower gum area. The airway is patent. There is no swelling under the tongue. The speech is clear and understandable.  Eyes: EOM and lids are normal. Pupils are equal, round, and reactive to light.  Neck: Normal range of motion. Neck supple. Carotid bruit is not present.  Cardiovascular: Normal rate,  regular rhythm, normal heart sounds, intact distal pulses and normal pulses.   Pulmonary/Chest: Breath sounds normal. No respiratory distress.  Abdominal: Soft. Bowel sounds are normal. There is no tenderness. There is no guarding.  Musculoskeletal: Normal range of motion.  Lymphadenopathy:       Head (right side): No submandibular adenopathy present.       Head (left side): No submandibular adenopathy present.    He has no cervical adenopathy.  Neurological: He is alert and oriented to person, place, and time. He has normal strength. No cranial nerve deficit or sensory deficit.  Skin: Skin is warm and dry.  Psychiatric: He has a normal mood and affect. His speech is normal.    ED Course  Procedures (including critical care time) Labs Review Labs Reviewed - No data to display  Imaging Review No results found.   EKG Interpretation None      MDM  Vital signs are within normal limits. Patient has multiple dental caries. The airway remains patent however, and there is no evidence for Ludwig's angina. The patient will be treated with clindamycin and ibuprofen. The danger of possible infection has been discussed with the patient. Patient strongly encouraged to see a dentist as sone as possible.    Final diagnoses:  Dental caries    **I have reviewed nursing notes, vital signs, and all appropriate lab and imaging results for this patient.Kathie Dike, PA-C 11/01/13 [phone]

## 2014-06-26 ENCOUNTER — Encounter (HOSPITAL_BASED_OUTPATIENT_CLINIC_OR_DEPARTMENT_OTHER): Payer: Self-pay | Admitting: *Deleted

## 2014-06-26 ENCOUNTER — Emergency Department (HOSPITAL_BASED_OUTPATIENT_CLINIC_OR_DEPARTMENT_OTHER)
Admission: EM | Admit: 2014-06-26 | Discharge: 2014-06-26 | Disposition: A | Discharge status: Home / Self Care | Payer: Self-pay | Attending: Emergency Medicine | Admitting: Emergency Medicine

## 2014-06-26 DIAGNOSIS — S8992XA Unspecified injury of left lower leg, initial encounter: Secondary | ICD-10-CM | POA: Insufficient documentation

## 2014-06-26 DIAGNOSIS — Y9389 Activity, other specified: Secondary | ICD-10-CM | POA: Insufficient documentation

## 2014-06-26 DIAGNOSIS — X58XXXA Exposure to other specified factors, initial encounter: Secondary | ICD-10-CM | POA: Insufficient documentation

## 2014-06-26 DIAGNOSIS — Y998 Other external cause status: Secondary | ICD-10-CM | POA: Insufficient documentation

## 2014-06-26 DIAGNOSIS — Y9289 Other specified places as the place of occurrence of the external cause: Secondary | ICD-10-CM | POA: Insufficient documentation

## 2014-06-26 DIAGNOSIS — M25562 Pain in left knee: Secondary | ICD-10-CM

## 2014-06-26 DIAGNOSIS — Z8679 Personal history of other diseases of the circulatory system: Secondary | ICD-10-CM | POA: Insufficient documentation

## 2014-06-26 HISTORY — DX: Opioid abuse, uncomplicated: F11.10

## 2014-06-26 MED ORDER — NAPROXEN 250 MG PO TABS
500.0000 mg | ORAL_TABLET | Freq: Once | ORAL | Status: AC
  Administered 2014-06-26: 500 mg via ORAL
  Filled 2014-06-26: qty 2

## 2014-06-26 NOTE — ED Notes (Signed)
C/o right knee pain after he stretched out in bed and felt something pop in knee when he was having withdrawals frpm heroin. States he has back pain in the evening. [facility]

## 2014-06-26 NOTE — Discharge Instructions (Signed)

## 2014-06-26 NOTE — ED Provider Notes (Signed)
CSN: [id number]     Arrival date & time 06/26/14  [id number] History   First MD Initiated Contact with Patient 06/26/14 [phone]     Chief Complaint  Patient presents with  . Knee Pain     (Consider location/radiation/quality/duration/timing/severity/associated sxs/prior Treatment) Patient is a 29 y.o. male presenting with knee pain.  Knee Pain Location:  Knee Time since incident:  2 weeks Injury: yes   Mechanism of injury comment:  Stretching his leg in bed and felt a pop Knee location:  L knee Pain details:    Quality:  Aching   Radiates to:  Does not radiate   Severity:  Severe   Onset quality:  Gradual   Duration:  2 weeks   Timing:  Constant   Progression:  Worsening Chronicity:  New Relieved by: aleve, transiently. Worsened by:  Bearing weight (movement) Associated symptoms: no fever, no muscle weakness and no swelling     Past Medical History  Diagnosis Date  . Heart enlargement   . Heroin abuse    Past Surgical History  Procedure Laterality Date  . Hand surgery     No family history on file. History  Substance Use Topics  . Smoking status: Current Every Day Smoker -- 0.50 packs/day    Types: Cigarettes  . Smokeless tobacco: Former NeurosurgeonUser  . Alcohol Use: No    Review of Systems  Constitutional: Negative for fever.  All other systems reviewed and are negative.     Allergies  Review of patient's allergies indicates no known allergies.  Home Medications   Prior to Admission medications   Medication Sig Start Date End Date Taking? Authorizing Provider  ibuprofen (ADVIL,MOTRIN) 200 MG tablet Take 800 mg by mouth every 6 (six) hours as needed.   Yes Historical Provider, MD   BP 126/80 mmHg  Pulse 92  Temp(Src) 98.3 F (36.8 C) (Oral)  Ht 6' (1.829 m)  Wt 140 lb (63.504 kg)  BMI 18.98 kg/m2  SpO2 100% Physical Exam  Constitutional: He is oriented to person, place, and time. He appears well-developed and well-nourished. No distress.  HENT:  Head:  Normocephalic and atraumatic.  Eyes: Conjunctivae are normal. No scleral icterus.  Neck: Neck supple.  Cardiovascular: Normal rate and intact distal pulses.   Pulmonary/Chest: Effort normal. No stridor. No respiratory distress.  Abdominal: Normal appearance. He exhibits no distension.  Musculoskeletal:       Left knee: He exhibits normal range of motion, no swelling, no effusion, no ecchymosis and no deformity. Tenderness (insertion of IT band.  IT band is tight. ) found.  Neurological: He is alert and oriented to person, place, and time.  Skin: Skin is warm and dry. No rash noted.  Psychiatric: He has a normal mood and affect. His behavior is normal.  Nursing note and vitals reviewed.   ED Course  Procedures (including critical care time) Labs Review Labs Reviewed - No data to display  Imaging Review No results found.   EKG Interpretation None      MDM   Final diagnoses:  Left knee pain    MSK pain for two weeks.  No concern for bony injury by history or exam.  Treat with NSAIDs, PCP followup,  PT.      Blake DivineJohn Ariannah Arenson, MD 06/26/14 [phone]

## 2014-06-26 NOTE — ED Notes (Signed)
Patient preparing for discharge. 

## 2014-10-24 IMAGING — CR DG KNEE COMPLETE 4+V*R*
4 series · 4 of 4 positions shown · non-contrast
Comparison: None.

CLINICAL DATA: Right knee pain for 3 days.  No known injury.

EXAM:
RIGHT KNEE - COMPLETE 4+ VIEW

[view not recorded (1 of 4)]
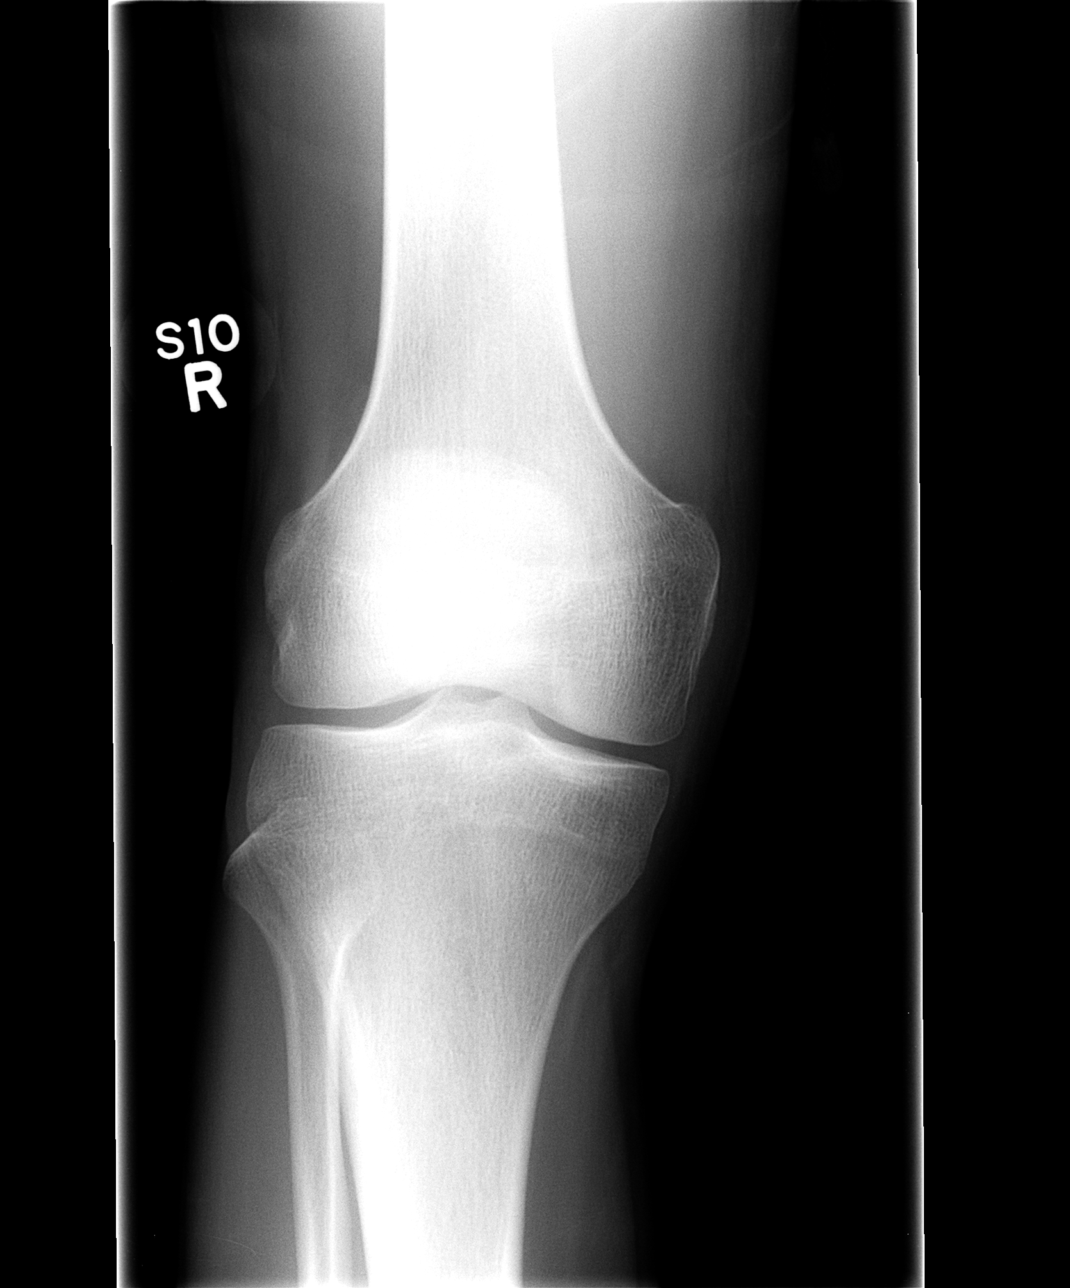

[view not recorded (2 of 4)]
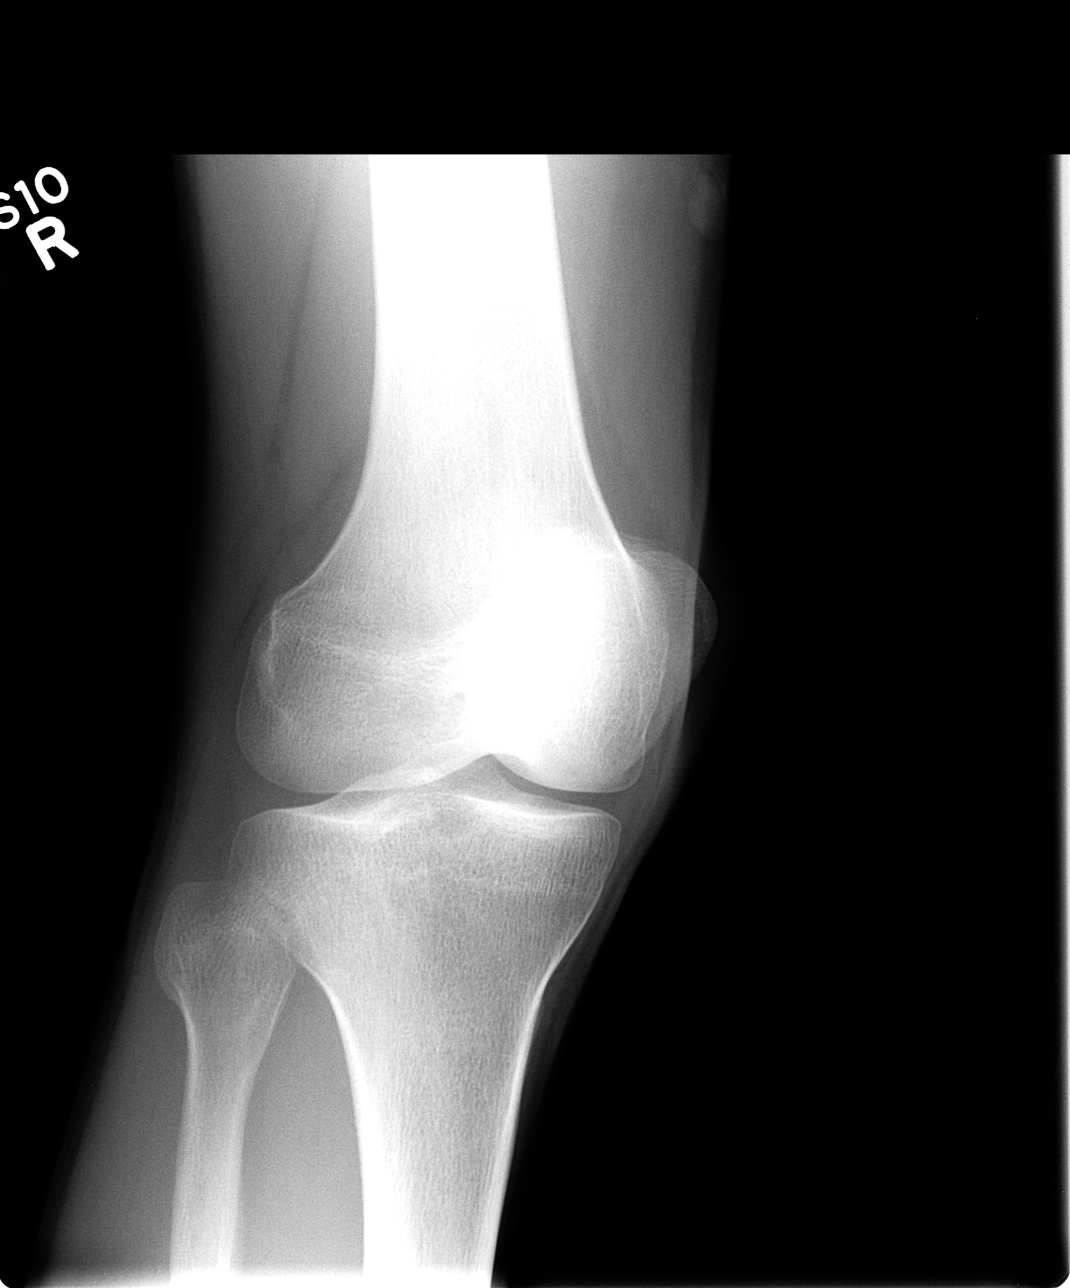

[view not recorded (3 of 4)]
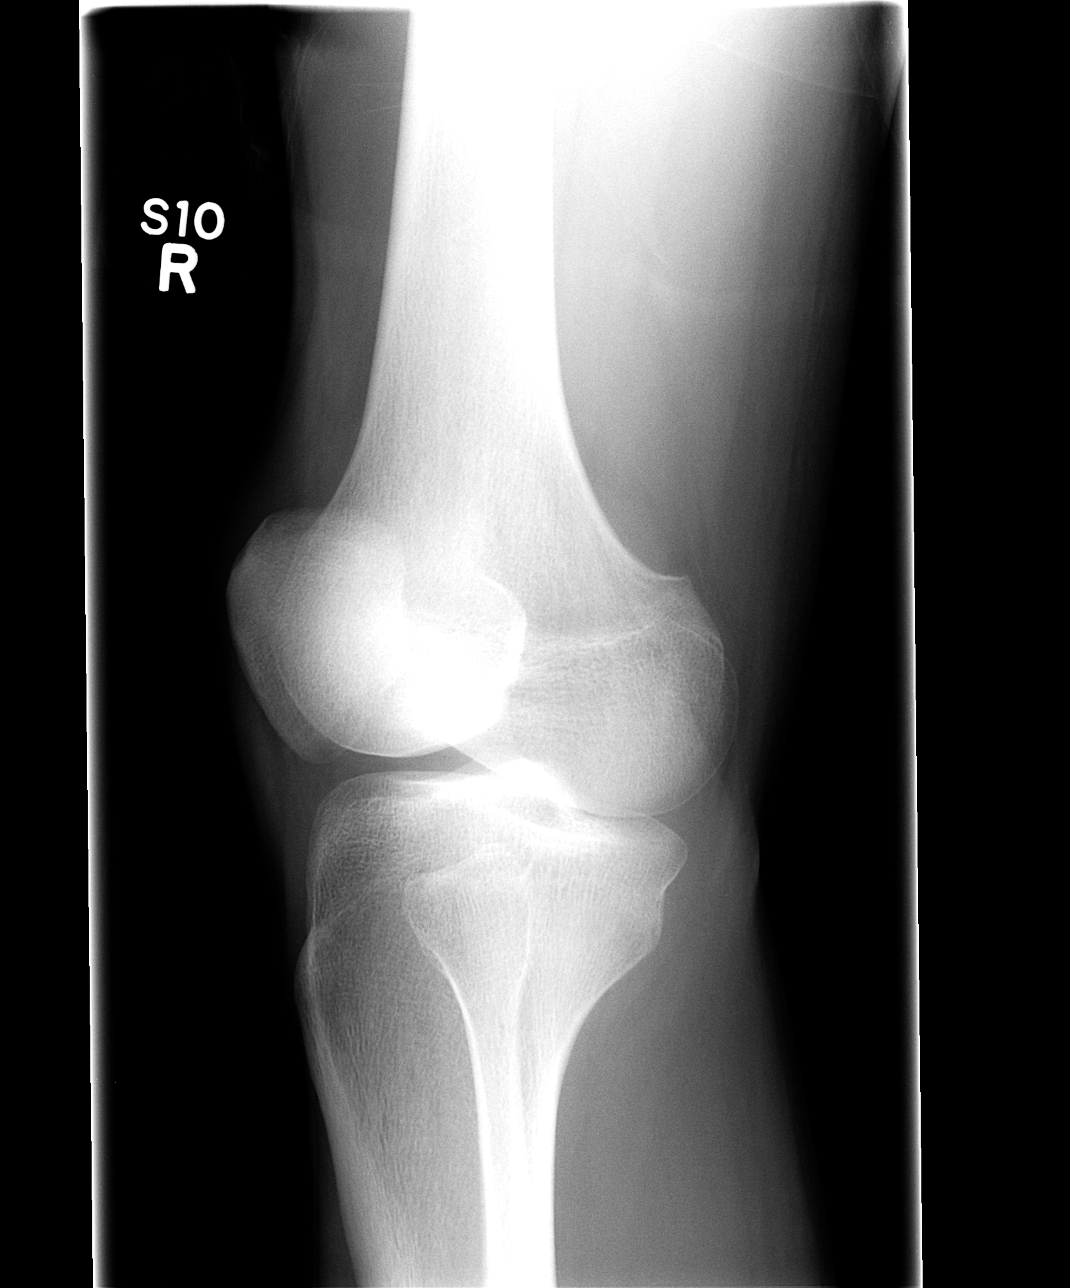

[view not recorded (4 of 4)]
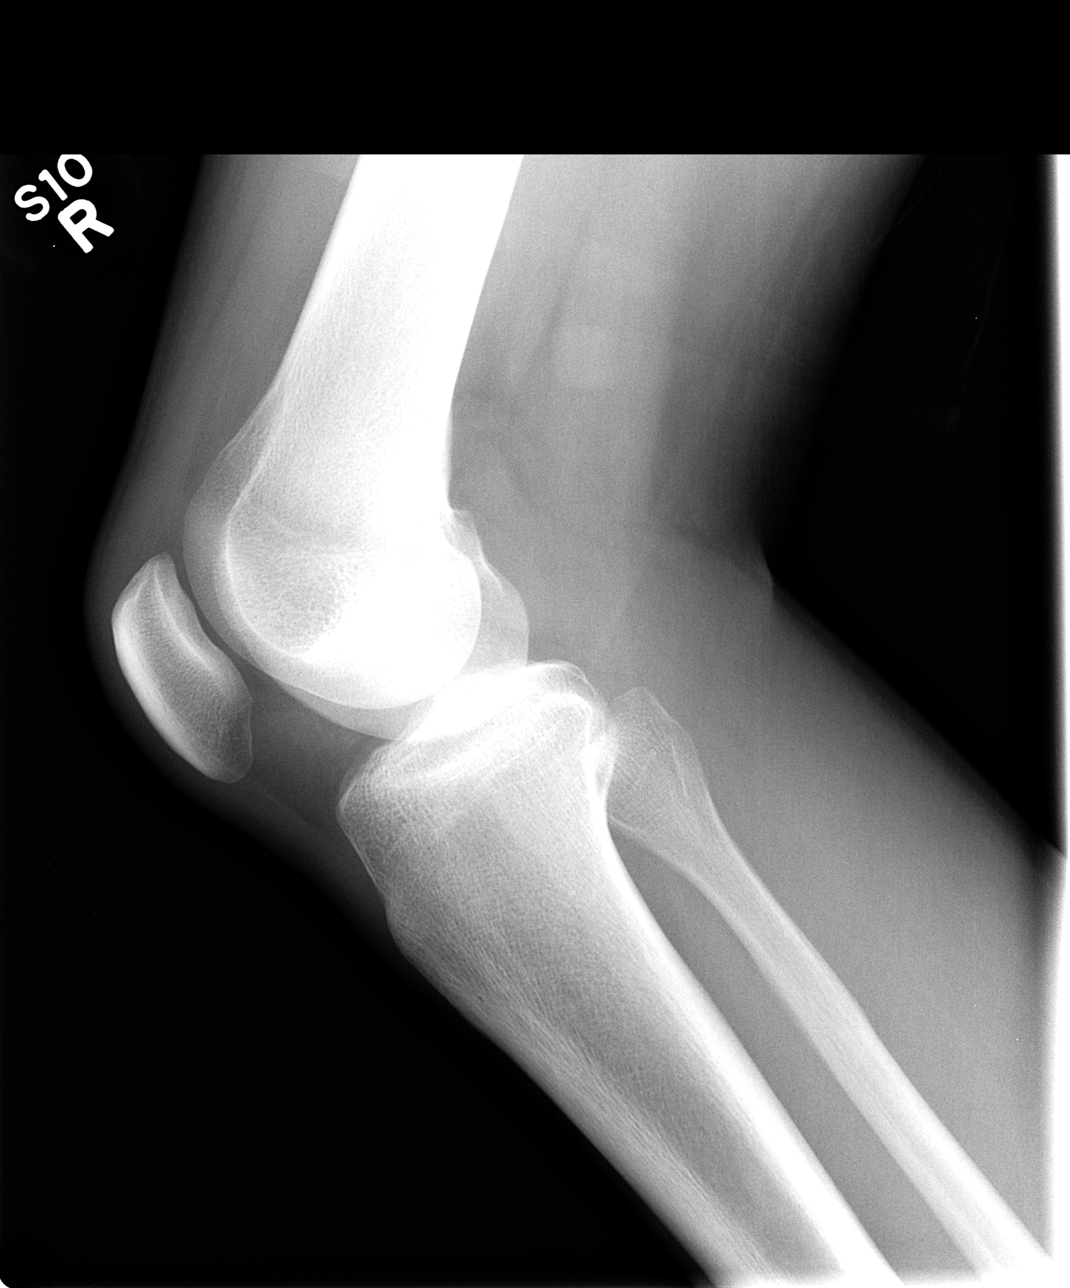

[4 of 4 positions shown; findings below may reference images not displayed]

FINDINGS: There is no evidence of fracture, dislocation, or joint effusion.
There is no evidence of arthropathy or other focal bone abnormality.
Soft tissues are unremarkable.
IMPRESSION: Negative.

## 2016-12-16 ENCOUNTER — Encounter (HOSPITAL_COMMUNITY): Payer: Self-pay | Admitting: *Deleted

## 2016-12-16 ENCOUNTER — Emergency Department (HOSPITAL_COMMUNITY)
Admission: EM | Admit: 2016-12-16 | Discharge: 2016-12-16 | Disposition: A | Discharge status: Home / Self Care | Payer: Self-pay | Attending: Emergency Medicine | Admitting: Emergency Medicine

## 2016-12-16 DIAGNOSIS — F17210 Nicotine dependence, cigarettes, uncomplicated: Secondary | ICD-10-CM | POA: Insufficient documentation

## 2016-12-16 DIAGNOSIS — R220 Localized swelling, mass and lump, head: Secondary | ICD-10-CM | POA: Insufficient documentation

## 2016-12-16 DIAGNOSIS — K0889 Other specified disorders of teeth and supporting structures: Secondary | ICD-10-CM | POA: Insufficient documentation

## 2016-12-16 DIAGNOSIS — K029 Dental caries, unspecified: Secondary | ICD-10-CM | POA: Insufficient documentation

## 2016-12-16 MED ORDER — IBUPROFEN 800 MG PO TABS
800.0000 mg | ORAL_TABLET | Freq: Once | ORAL | Status: AC
  Administered 2016-12-16: 800 mg via ORAL
  Filled 2016-12-16: qty 1

## 2016-12-16 MED ORDER — ACETAMINOPHEN 325 MG PO TABS
650.0000 mg | ORAL_TABLET | Freq: Once | ORAL | Status: AC
  Administered 2016-12-16: 650 mg via ORAL
  Filled 2016-12-16: qty 2

## 2016-12-16 MED ORDER — CEFTRIAXONE SODIUM 1 G IJ SOLR
1.0000 g | Freq: Once | INTRAMUSCULAR | Status: AC
  Administered 2016-12-16: 1 g via INTRAMUSCULAR
  Filled 2016-12-16: qty 10

## 2016-12-16 MED ORDER — LIDOCAINE HCL (PF) 1 % IJ SOLN
INTRAMUSCULAR | Status: AC
  Administered 2016-12-16: 2.1 mL
  Filled 2016-12-16: qty 5

## 2016-12-16 MED ORDER — AMOXICILLIN 500 MG PO CAPS: 500.0000 mg | ORAL_CAPSULE | Freq: Three times a day (TID) | ORAL | 0 refills | Status: DC

## 2016-12-16 NOTE — Discharge Instructions (Signed)
Please use Amoxil 3 times daily with food.  Please use 600 mg of ibuprofen and 500 mg of Tylenol every 6 hours for pain and discomfort.  It is important that you see a dentist as soon as possible for resolution of this dental issue.

## 2016-12-16 NOTE — ED Triage Notes (Signed)
Pt reports a broken tooth on the lower right side of his mouth. Pt states the tooth has been broken off for awhile now. Pt reports facial swelling and dental pain.

## 2016-12-16 NOTE — ED Provider Notes (Signed)
[facility]   CSN: [id number] Arrival date & time: 12/16/16  [id number]     History   Chief Complaint Chief Complaint  Patient presents with  . Dental Pain    HPI Gibson Rampimothy Quinney is a 31 y.o. male.  Patient is a 31 year old male who presents to the emergency department with a complaint of toothache.  The patient states that he has a broken tooth on the right lower jaw area.  The tooth has been broken for some time, but now he has pain and he thinks he may have some facial swelling.  No fever reported.  No nausea vomiting reported.  No difficulty with swallowing or breathing.      Past Medical History:  Diagnosis Date  . Heart enlargement   . Heroin abuse (HCC)     There are no active problems to display for this patient.   Past Surgical History:  Procedure Laterality Date  . HAND SURGERY         Home Medications    Prior to Admission medications   Medication Sig Start Date End Date Taking? Authorizing Provider  ibuprofen (ADVIL,MOTRIN) 200 MG tablet Take 800 mg by mouth every 6 (six) hours as needed.    [provider]    Family History History reviewed. No pertinent family history.  Social History Social History  Substance Use Topics  . Smoking status: Current Every Day Smoker    Packs/day: 0.50    Types: Cigarettes  . Smokeless tobacco: Former NeurosurgeonUser  . Alcohol use No     Allergies   Patient has no known allergies.   Review of Systems Review of Systems  Constitutional: Negative for activity change.       All ROS Neg except as noted in HPI  HENT: Positive for dental problem. Negative for nosebleeds.   Eyes: Negative for photophobia and discharge.  Respiratory: Negative for cough, shortness of breath and wheezing.   Cardiovascular: Negative for chest pain and palpitations.  Gastrointestinal: Negative for abdominal pain and blood in stool.  Genitourinary: Negative for dysuria, frequency and hematuria.    Musculoskeletal: Negative for arthralgias, back pain and neck pain.  Skin: Negative.   Neurological: Negative for dizziness, seizures and speech difficulty.  Psychiatric/Behavioral: Negative for confusion and hallucinations.     Physical Exam Updated Vital Signs BP 126/89 (BP Location: Left Arm)   Pulse 82   Temp 98.5 F (36.9 C) (Oral)   Resp 18   Ht 6' (1.829 m)   Wt 63.5 kg (140 lb)   SpO2 98%   BMI 18.99 kg/m   Physical Exam  Constitutional: He is oriented to person, place, and time. He appears well-developed and well-nourished.  Non-toxic appearance.  HENT:  Head: Normocephalic.  Right Ear: Tympanic membrane and external ear normal.  Left Ear: Tympanic membrane and external ear normal.  Mouth/Throat: Uvula is midline. Dental caries present. No uvula swelling.  Multiple dental caries noted.  There is swelling of the upper and lower gum on the right.  Airway is patent.  No swelling under the tongue.  Eyes: Pupils are equal, round, and reactive to light. EOM and lids are normal.  Neck: Normal range of motion. Neck supple. Carotid bruit is not present.  Cardiovascular: Normal rate, regular rhythm, normal heart sounds, intact distal pulses and normal pulses.   Pulmonary/Chest: Breath sounds normal. No respiratory distress.  Abdominal: Soft. Bowel sounds are normal. There is no tenderness. There is no guarding.  Musculoskeletal: Normal  range of motion.  Lymphadenopathy:       Head (right side): No submandibular adenopathy present.       Head (left side): No submandibular adenopathy present.    He has no cervical adenopathy.  Neurological: He is alert and oriented to person, place, and time. He has normal strength. No cranial nerve deficit or sensory deficit.  Skin: Skin is warm and dry.  Psychiatric: He has a normal mood and affect. His speech is normal.  Nursing note and vitals reviewed.    ED Treatments / Results  Labs (all labs ordered are listed, but only abnormal  results are displayed) Labs Reviewed - No data to display  EKG  EKG Interpretation None       Radiology No results found.  Procedures Procedures (including critical care time)  Medications Ordered in ED Medications - No data to display   Initial Impression / Assessment and Plan / ED Course  I have reviewed the triage vital signs and the nursing notes.  Pertinent labs & imaging results that were available during my care of the patient were reviewed by me and considered in my medical decision making (see chart for details).       Final Clinical Impressions(s) / ED Diagnoses MDM Vital signs are within normal limits.  Patient has multiple dental caries many of them decayed to the gumline.  There is swelling of the gum of the upper and lower right side.  No evidence forLudwig's angina or other acute changes or problems.  Patient treated in the emergency department with intramuscular Rocephin.  Prescription for Amoxil given to the patient.  The patient will use ibuprofen and Tylenol every 6 hours for pain and discomfort.  Dental resource guide given to the patient.   Final diagnoses:  Dental caries    New Prescriptions New Prescriptions   AMOXICILLIN (AMOXIL) 500 MG CAPSULE    Take 1 capsule (500 mg total) by mouth 3 (three) times daily.     Ivery Quale, PA-C 12/16/16 0454    Linwood Dibbles, MD 12/17/16 [phone]

## 2017-11-21 ENCOUNTER — Encounter (HOSPITAL_COMMUNITY): Payer: Self-pay | Admitting: Emergency Medicine

## 2017-11-21 ENCOUNTER — Emergency Department (HOSPITAL_COMMUNITY)
Admission: EM | Admit: 2017-11-21 | Discharge: 2017-11-21 | Disposition: A | Discharge status: Home / Self Care | Payer: Self-pay | Attending: Emergency Medicine | Admitting: Emergency Medicine

## 2017-11-21 ENCOUNTER — Other Ambulatory Visit: Payer: Self-pay

## 2017-11-21 DIAGNOSIS — K047 Periapical abscess without sinus: Secondary | ICD-10-CM | POA: Insufficient documentation

## 2017-11-21 DIAGNOSIS — K029 Dental caries, unspecified: Secondary | ICD-10-CM | POA: Insufficient documentation

## 2017-11-21 DIAGNOSIS — F17210 Nicotine dependence, cigarettes, uncomplicated: Secondary | ICD-10-CM | POA: Insufficient documentation

## 2017-11-21 MED ORDER — AMOXICILLIN 250 MG PO CAPS
500.0000 mg | ORAL_CAPSULE | Freq: Once | ORAL | Status: AC
  Administered 2017-11-21: 500 mg via ORAL
  Filled 2017-11-21: qty 2

## 2017-11-21 MED ORDER — AMOXICILLIN 500 MG PO CAPS: 500.0000 mg | ORAL_CAPSULE | Freq: Three times a day (TID) | ORAL | 0 refills | Status: AC

## 2017-11-21 MED ORDER — IBUPROFEN 800 MG PO TABS: 800.0000 mg | ORAL_TABLET | Freq: Three times a day (TID) | ORAL | 0 refills | Status: AC

## 2017-11-21 MED ORDER — IBUPROFEN 800 MG PO TABS
800.0000 mg | ORAL_TABLET | Freq: Once | ORAL | Status: AC
  Administered 2017-11-21: 800 mg via ORAL
  Filled 2017-11-21: qty 1

## 2017-11-21 NOTE — ED Provider Notes (Signed)
[facility] EMERGENCY DEPARTMENT Provider Note   CSN: [id number] Arrival date & time: 11/21/17  1650     History   Chief Complaint Chief Complaint  Patient presents with  . Dental Pain    HPI Sean Wilkins is a 32 y.o. male  Presenting with a 2day history of dental pain and gingival swelling.   The patient has a history of injury and/or decay in the tooth involved which has recently started to cause increased  pain.  There has been no fevers, chills, nausea or vomiting, also no complaint of difficulty swallowing, although chewing makes pain worse.  The patient has tried orajel without relief of symptoms.     .  The history is provided by the patient.    Past Medical History:  Diagnosis Date  . Heart enlargement   . Heroin abuse (HCC)     There are no active problems to display for this patient.   Past Surgical History:  Procedure Laterality Date  . HAND SURGERY          Home Medications    Prior to Admission medications   Medication Sig Start Date End Date Taking? Authorizing Provider  amoxicillin (AMOXIL) 500 MG capsule Take 1 capsule (500 mg total) by mouth 3 (three) times daily for 10 days. 11/21/17 12/01/17  Burgess Amor, PA-C  ibuprofen (ADVIL,MOTRIN) 200 MG tablet Take 800 mg by mouth every 6 (six) hours as needed.    [provider]    Family History History reviewed. No pertinent family history.  Social History Social History   Tobacco Use  . Smoking status: Current Every Day Smoker    Packs/day: 0.50    Types: Cigarettes  . Smokeless tobacco: Former Engineer, water Use Topics  . Alcohol use: No  . Drug use: No     Allergies   Patient has no known allergies.   Review of Systems Review of Systems  Constitutional: Negative for fever.  HENT: Positive for dental problem. Negative for facial swelling and sore throat.   Respiratory: Negative for shortness of breath.   Musculoskeletal: Negative for neck pain and neck stiffness.      Physical Exam Updated Vital Signs BP 111/76 (BP Location: Right Arm)   Pulse 83   Temp 98.3 F (36.8 C) (Oral)   Resp 16   Ht 6\' 1"  (1.854 m)   Wt 63.5 kg   SpO2 100%   BMI 18.47 kg/m   Physical Exam  Constitutional: He is oriented to person, place, and time. He appears well-developed and well-nourished. No distress.  HENT:  Head: Normocephalic and atraumatic.  Right Ear: Tympanic membrane and external ear normal.  Left Ear: Tympanic membrane and external ear normal.  Mouth/Throat: Oropharynx is clear and moist and mucous membranes are normal. No oral lesions. No trismus in the jaw. Abnormal dentition. Dental caries present. No dental abscesses.  Generalized poor dentition with most teeth with some evidence of decay.  Right upper 1st and 2nd molar teeth with decay to the gingival line, moderate gingival edema around these  Remaining roots with no palpable abscess. Sublingual space soft.  Eyes: Conjunctivae are normal.  Neck: Normal range of motion. Neck supple.  Cardiovascular: Normal rate.  Pulmonary/Chest: Effort normal.  Musculoskeletal: Normal range of motion.  Lymphadenopathy:    He has no cervical adenopathy.  Neurological: He is alert and oriented to person, place, and time.  Skin: Skin is warm and dry. No erythema.  Psychiatric: He has a normal mood and  affect.     ED Treatments / Results  Labs (all labs ordered are listed, but only abnormal results are displayed) Labs Reviewed - No data to display  EKG None  Radiology No results found.  Procedures Procedures (including critical care time)  Medications Ordered in ED Medications  amoxicillin (AMOXIL) capsule 500 mg (has no administration in time range)  ibuprofen (ADVIL,MOTRIN) tablet 800 mg (has no administration in time range)     Initial Impression / Assessment and Plan / ED Course  I have reviewed the triage vital signs and the nursing notes.  Pertinent labs & imaging results that were  available during my care of the patient were reviewed by me and considered in my medical decision making (see chart for details).     Dental infection without obvious abscess.  Amoxil, ibuprofen. Referrals given.  No evidence for Lugwigs angina.  Final Clinical Impressions(s) / ED Diagnoses   Final diagnoses:  Dental infection  Dental caries    ED Discharge Orders         Ordered    amoxicillin (AMOXIL) 500 MG capsule  3 times daily     11/21/17 1749           Burgess Amor, PA-C 11/21/17 1753    Samuel Jester, DO 11/23/17 1423

## 2017-11-21 NOTE — ED Triage Notes (Signed)
Pt c/o of right upper dental pain x 2 days with pain and swelling

## 2017-11-21 NOTE — Discharge Instructions (Addendum)
Complete your entire course of antibiotics as prescribed.  I recommend taking ibuprofen (as prescribed) for assistance with pain relief.  Avoid applying heat or ice to this abscess area which can worsen your symptoms.  You may use warm salt water swish and spit treatment or half peroxide and water swish and spit after meals to keep this area clean as discussed. See the dental referral list attached.

## 2019-01-08 ENCOUNTER — Other Ambulatory Visit: Payer: Self-pay | Admitting: *Deleted

## 2019-01-08 DIAGNOSIS — Z20822 Contact with and (suspected) exposure to covid-19: Secondary | ICD-10-CM

## 2019-01-09 LAB — NOVEL CORONAVIRUS, NAA: SARS-CoV-2, NAA: NOT DETECTED

## 2019-01-13 ENCOUNTER — Telehealth: Payer: Self-pay

## 2019-01-13 NOTE — Telephone Encounter (Signed)
Called and informed patient that test for Covid 19 was NEGATIVE. Discussed signs and symptoms of Covid 19 : fever, chills, respiratory symptoms, cough, ENT symptoms, sore throat, SOB, muscle pain, diarrhea, headache, loss of taste/smell, close exposure to COVID-19 patient. Pt instructed to call PCP if they develop the above signs and sx. Pt also instructed to call 911 if having respiratory issues/distress. Discussed MyChart enrollment. Pt verbalized understanding.  

## 2020-05-14 ENCOUNTER — Ambulatory Visit (HOSPITAL_COMMUNITY): Payer: No Payment, Other | Admitting: Behavioral Health

## 2020-05-14 ENCOUNTER — Other Ambulatory Visit: Payer: Self-pay

## 2020-05-14 DIAGNOSIS — F1120 Opioid dependence, uncomplicated: Secondary | ICD-10-CM

## 2020-05-14 NOTE — Progress Notes (Signed)
Sean Wilkins is a 35 y.o. male client who presents today for a virtual [facility] intake appointment with this Clinical research associate. Client reports heroin and suboxone to be his drug(s) of choice. Client reports "2 weeks ago" as his date of last use (Route of use: strips - 8mg  daily). Client meets criteria for ASAM Level 2.1 treatment. [name] is being recommended for Substance Abuse Intensive Outpatient Program [facility]). Client completed a person-centered treatment plan and a comprehensive prevention & crisis plan with this [facility]. Client has agreed to attend [facility] group therapy on Mondays, Wednesdays, and Fridays from 9:00am - 12:00pm. Group rules and group expectations were discussed with client during today's session. Client did not express any concerns regarding group rules and expectations.   [facility] Start Date:  TBD  Virtual Visit via Video Note  I connected with Sean Wilkins on 05/14/20 at  2:00 PM EDT by a video enabled telemedicine application and verified that I am speaking with the correct person using two identifiers.  Location: Patient: Home - [facility] Provider: Home Office - [place]   I discussed the limitations of evaluation and management by telemedicine and the availability of in person appointments. The patient expressed understanding and agreed to proceed.  I discussed the assessment and treatment plan with the patient. The patient was provided an opportunity to ask questions and all were answered. The patient agreed with the plan and demonstrated an understanding of the instructions.   The patient was advised to call back or seek an in-person evaluation if the symptoms worsen or if the condition fails to improve as anticipated.  I provided 45 minutes of non-face-to-face time during this encounter.   Rakisha Pincock S Louretta Tantillo, Counselor           STEIN, Counselor

## 2020-05-18 ENCOUNTER — Ambulatory Visit (HOSPITAL_COMMUNITY): Payer: No Payment, Other | Admitting: Behavioral Health
